# Patient Record
Sex: Male | Born: 1945 | Race: Asian | Hispanic: No | Marital: Married | Smoking: Former smoker
Health system: Southern US, Community
[De-identification: ages and names within clinical notes are randomized; demographics above are authoritative.]

## PROBLEM LIST (undated history)

## (undated) DIAGNOSIS — E785 Hyperlipidemia, unspecified: Secondary | ICD-10-CM

## (undated) DIAGNOSIS — H547 Unspecified visual loss: Secondary | ICD-10-CM

## (undated) DIAGNOSIS — B9681 Helicobacter pylori [H. pylori] as the cause of diseases classified elsewhere: Secondary | ICD-10-CM

## (undated) DIAGNOSIS — I10 Essential (primary) hypertension: Secondary | ICD-10-CM

## (undated) DIAGNOSIS — M79604 Pain in right leg: Secondary | ICD-10-CM

## (undated) DIAGNOSIS — B029 Zoster without complications: Secondary | ICD-10-CM

## (undated) DIAGNOSIS — E119 Type 2 diabetes mellitus without complications: Secondary | ICD-10-CM

## (undated) DIAGNOSIS — C9200 Acute myeloblastic leukemia, not having achieved remission: Secondary | ICD-10-CM

## (undated) DIAGNOSIS — M659 Unspecified synovitis and tenosynovitis, unspecified site: Secondary | ICD-10-CM

## (undated) DIAGNOSIS — K2970 Gastritis, unspecified, without bleeding: Secondary | ICD-10-CM

## (undated) DIAGNOSIS — I639 Cerebral infarction, unspecified: Secondary | ICD-10-CM

## (undated) DIAGNOSIS — G629 Polyneuropathy, unspecified: Secondary | ICD-10-CM

## (undated) HISTORY — DX: Type 2 diabetes mellitus without complications: E11.9

## (undated) HISTORY — DX: Zoster without complications: B02.9

## (undated) HISTORY — DX: Unspecified visual loss: H54.7

## (undated) HISTORY — PX: KIDNEY SURGERY: SHX687

## (undated) HISTORY — DX: Cerebral infarction, unspecified: I63.9

## (undated) HISTORY — DX: Gastritis, unspecified, without bleeding: K29.70

## (undated) HISTORY — DX: Helicobacter pylori (H. pylori) as the cause of diseases classified elsewhere: B96.81

## (undated) HISTORY — DX: Acute myeloblastic leukemia, not having achieved remission: C92.00

## (undated) HISTORY — DX: Essential (primary) hypertension: I10

## (undated) HISTORY — PX: LAPAROTOMY: SHX154

## (undated) HISTORY — PX: CATARACT EXTRACTION: SUR2

## (undated) HISTORY — DX: Pain in right leg: M79.604

## (undated) HISTORY — DX: Unspecified synovitis and tenosynovitis, unspecified site: M65.90

## (undated) HISTORY — DX: Hyperlipidemia, unspecified: E78.5

## (undated) HISTORY — DX: Synovitis and tenosynovitis, unspecified: M65.9

## (undated) HISTORY — DX: Polyneuropathy, unspecified: G62.9

---

## 2008-01-29 ENCOUNTER — Ambulatory Visit: Payer: Self-pay | Admitting: Internal Medicine

## 2008-01-29 DIAGNOSIS — M659 Unspecified synovitis and tenosynovitis, unspecified site: Secondary | ICD-10-CM | POA: Insufficient documentation

## 2008-01-29 DIAGNOSIS — M79609 Pain in unspecified limb: Secondary | ICD-10-CM | POA: Insufficient documentation

## 2008-01-30 ENCOUNTER — Encounter: Payer: Self-pay | Admitting: Internal Medicine

## 2008-01-30 DIAGNOSIS — E785 Hyperlipidemia, unspecified: Secondary | ICD-10-CM

## 2008-01-30 DIAGNOSIS — I10 Essential (primary) hypertension: Secondary | ICD-10-CM | POA: Insufficient documentation

## 2008-01-30 DIAGNOSIS — E1165 Type 2 diabetes mellitus with hyperglycemia: Secondary | ICD-10-CM

## 2008-03-11 ENCOUNTER — Encounter: Payer: Self-pay | Admitting: Internal Medicine

## 2008-03-12 ENCOUNTER — Ambulatory Visit: Payer: Self-pay | Admitting: Internal Medicine

## 2008-03-12 DIAGNOSIS — Z8719 Personal history of other diseases of the digestive system: Secondary | ICD-10-CM | POA: Insufficient documentation

## 2008-03-12 DIAGNOSIS — E1142 Type 2 diabetes mellitus with diabetic polyneuropathy: Secondary | ICD-10-CM

## 2008-03-13 LAB — CONVERTED CEMR LAB
Folate: 12.1 ng/mL
Vitamin B-12: 358 pg/mL (ref 211–911)

## 2008-03-14 ENCOUNTER — Telehealth: Payer: Self-pay | Admitting: Internal Medicine

## 2008-03-22 ENCOUNTER — Telehealth: Payer: Self-pay | Admitting: Internal Medicine

## 2008-05-15 ENCOUNTER — Ambulatory Visit: Payer: Self-pay | Admitting: Internal Medicine

## 2008-07-16 HISTORY — PX: OTHER SURGICAL HISTORY: SHX169

## 2008-09-13 ENCOUNTER — Ambulatory Visit: Payer: Self-pay | Admitting: Internal Medicine

## 2008-09-13 ENCOUNTER — Telehealth: Payer: Self-pay | Admitting: Internal Medicine

## 2008-09-13 ENCOUNTER — Ambulatory Visit: Payer: Self-pay | Admitting: Diagnostic Radiology

## 2008-09-13 ENCOUNTER — Ambulatory Visit (HOSPITAL_BASED_OUTPATIENT_CLINIC_OR_DEPARTMENT_OTHER): Admission: RE | Admit: 2008-09-13 | Discharge: 2008-09-13 | Payer: Self-pay | Admitting: Internal Medicine

## 2008-09-13 DIAGNOSIS — R079 Chest pain, unspecified: Secondary | ICD-10-CM

## 2008-09-25 ENCOUNTER — Encounter: Payer: Self-pay | Admitting: Internal Medicine

## 2008-10-02 ENCOUNTER — Ambulatory Visit: Payer: Self-pay | Admitting: Cardiology

## 2008-12-11 ENCOUNTER — Ambulatory Visit: Payer: Self-pay | Admitting: Internal Medicine

## 2008-12-14 DIAGNOSIS — I6350 Cerebral infarction due to unspecified occlusion or stenosis of unspecified cerebral artery: Secondary | ICD-10-CM | POA: Insufficient documentation

## 2008-12-14 DIAGNOSIS — H543 Unqualified visual loss, both eyes: Secondary | ICD-10-CM

## 2008-12-18 ENCOUNTER — Encounter: Payer: Self-pay | Admitting: Internal Medicine

## 2009-01-23 ENCOUNTER — Telehealth: Payer: Self-pay | Admitting: Internal Medicine

## 2009-03-14 ENCOUNTER — Ambulatory Visit: Payer: Self-pay | Admitting: Internal Medicine

## 2009-03-14 DIAGNOSIS — L02838 Carbuncle of other sites: Secondary | ICD-10-CM

## 2009-03-14 DIAGNOSIS — L02828 Furuncle of other sites: Secondary | ICD-10-CM | POA: Insufficient documentation

## 2009-04-23 ENCOUNTER — Encounter: Payer: Self-pay | Admitting: Internal Medicine

## 2009-05-22 ENCOUNTER — Ambulatory Visit: Payer: Self-pay | Admitting: Internal Medicine

## 2009-05-22 ENCOUNTER — Telehealth: Payer: Self-pay | Admitting: Internal Medicine

## 2009-05-22 DIAGNOSIS — R5383 Other fatigue: Secondary | ICD-10-CM

## 2009-05-22 DIAGNOSIS — L608 Other nail disorders: Secondary | ICD-10-CM | POA: Insufficient documentation

## 2009-05-22 DIAGNOSIS — L259 Unspecified contact dermatitis, unspecified cause: Secondary | ICD-10-CM | POA: Insufficient documentation

## 2009-05-22 DIAGNOSIS — R5381 Other malaise: Secondary | ICD-10-CM

## 2009-05-22 DIAGNOSIS — C9201 Acute myeloblastic leukemia, in remission: Secondary | ICD-10-CM

## 2009-05-22 LAB — CONVERTED CEMR LAB
BUN: 19 mg/dL (ref 6–23)
Basophils Relative: 0 % (ref 0–1)
CO2: 24 meq/L (ref 19–32)
Creatinine, Ser: 1.31 mg/dL (ref 0.40–1.50)
Eosinophils Relative: 3 % (ref 0–5)
Glucose, Bld: 110 mg/dL — ABNORMAL HIGH (ref 70–99)
HCT: 39.5 % (ref 39.0–52.0)
Hemoglobin: 13.5 g/dL (ref 13.0–17.0)
Lymphocytes Relative: 59 % — ABNORMAL HIGH (ref 12–46)
Lymphs Abs: 2.2 10*3/uL (ref 0.7–4.0)
Monocytes Relative: 7 % (ref 3–12)
Neutrophils Relative %: 32 % — ABNORMAL LOW (ref 43–77)
Platelets: 111 10*3/uL — ABNORMAL LOW (ref 150–400)
Sodium: 140 meq/L (ref 135–145)
TSH: 0.352 microintl units/mL (ref 0.350–4.500)
Vit D, 1,25-Dihydroxy: 45 (ref 30–89)
WBC: 3.8 10*3/uL — ABNORMAL LOW (ref 4.0–10.5)

## 2009-05-23 ENCOUNTER — Ambulatory Visit: Payer: Self-pay | Admitting: Hematology & Oncology

## 2009-05-28 ENCOUNTER — Ambulatory Visit: Payer: Self-pay | Admitting: Internal Medicine

## 2009-08-16 DIAGNOSIS — B029 Zoster without complications: Secondary | ICD-10-CM

## 2009-08-16 DIAGNOSIS — C9200 Acute myeloblastic leukemia, not having achieved remission: Secondary | ICD-10-CM

## 2009-08-16 HISTORY — DX: Zoster without complications: B02.9

## 2009-08-16 HISTORY — DX: Acute myeloblastic leukemia, not having achieved remission: C92.00

## 2009-08-22 ENCOUNTER — Telehealth (INDEPENDENT_AMBULATORY_CARE_PROVIDER_SITE_OTHER): Payer: Self-pay | Admitting: *Deleted

## 2009-10-08 ENCOUNTER — Encounter: Payer: Self-pay | Admitting: Internal Medicine

## 2009-12-20 IMAGING — CR DG CHEST 2V
2 series · 2 of 2 positions shown · non-contrast
Comparison: None

CLINICAL DATA: Chest pain

CHEST - 2 VIEW

[w chest pa]
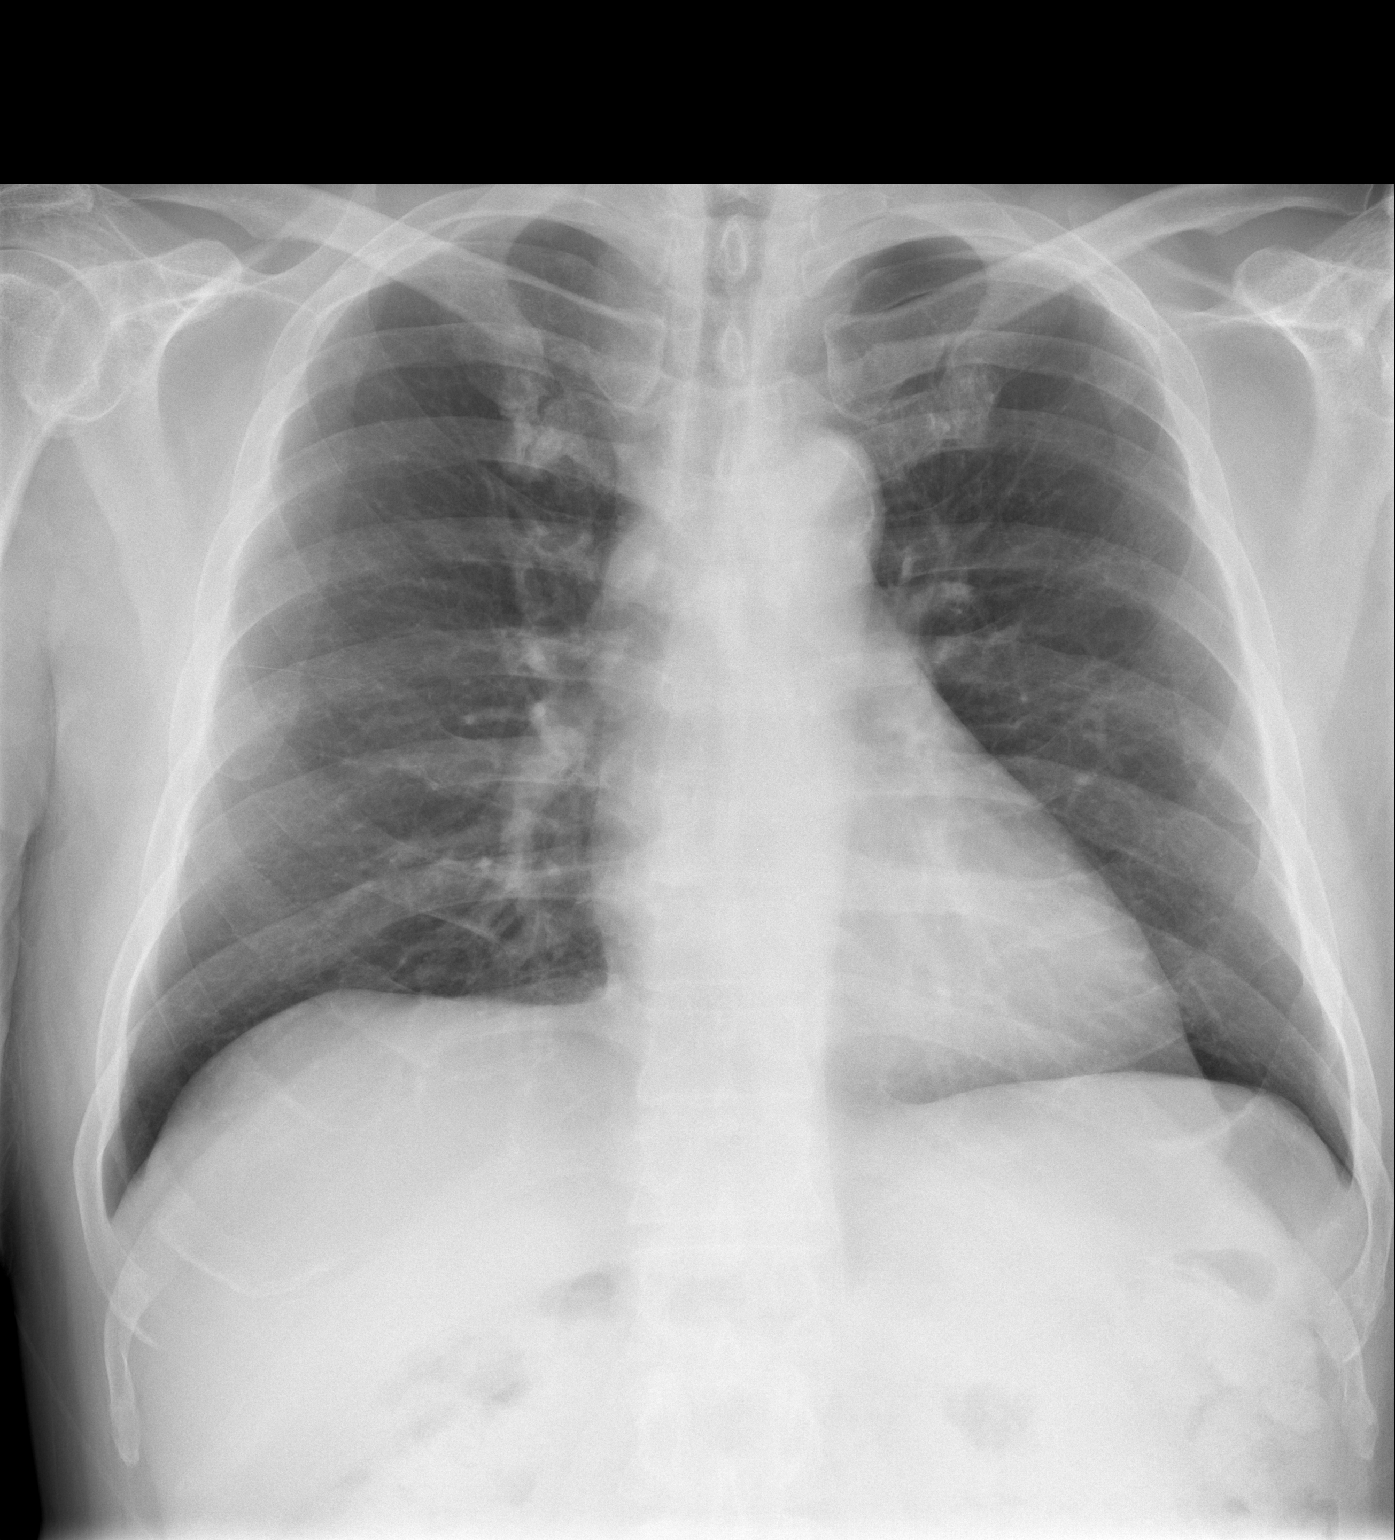

[w chest lat]
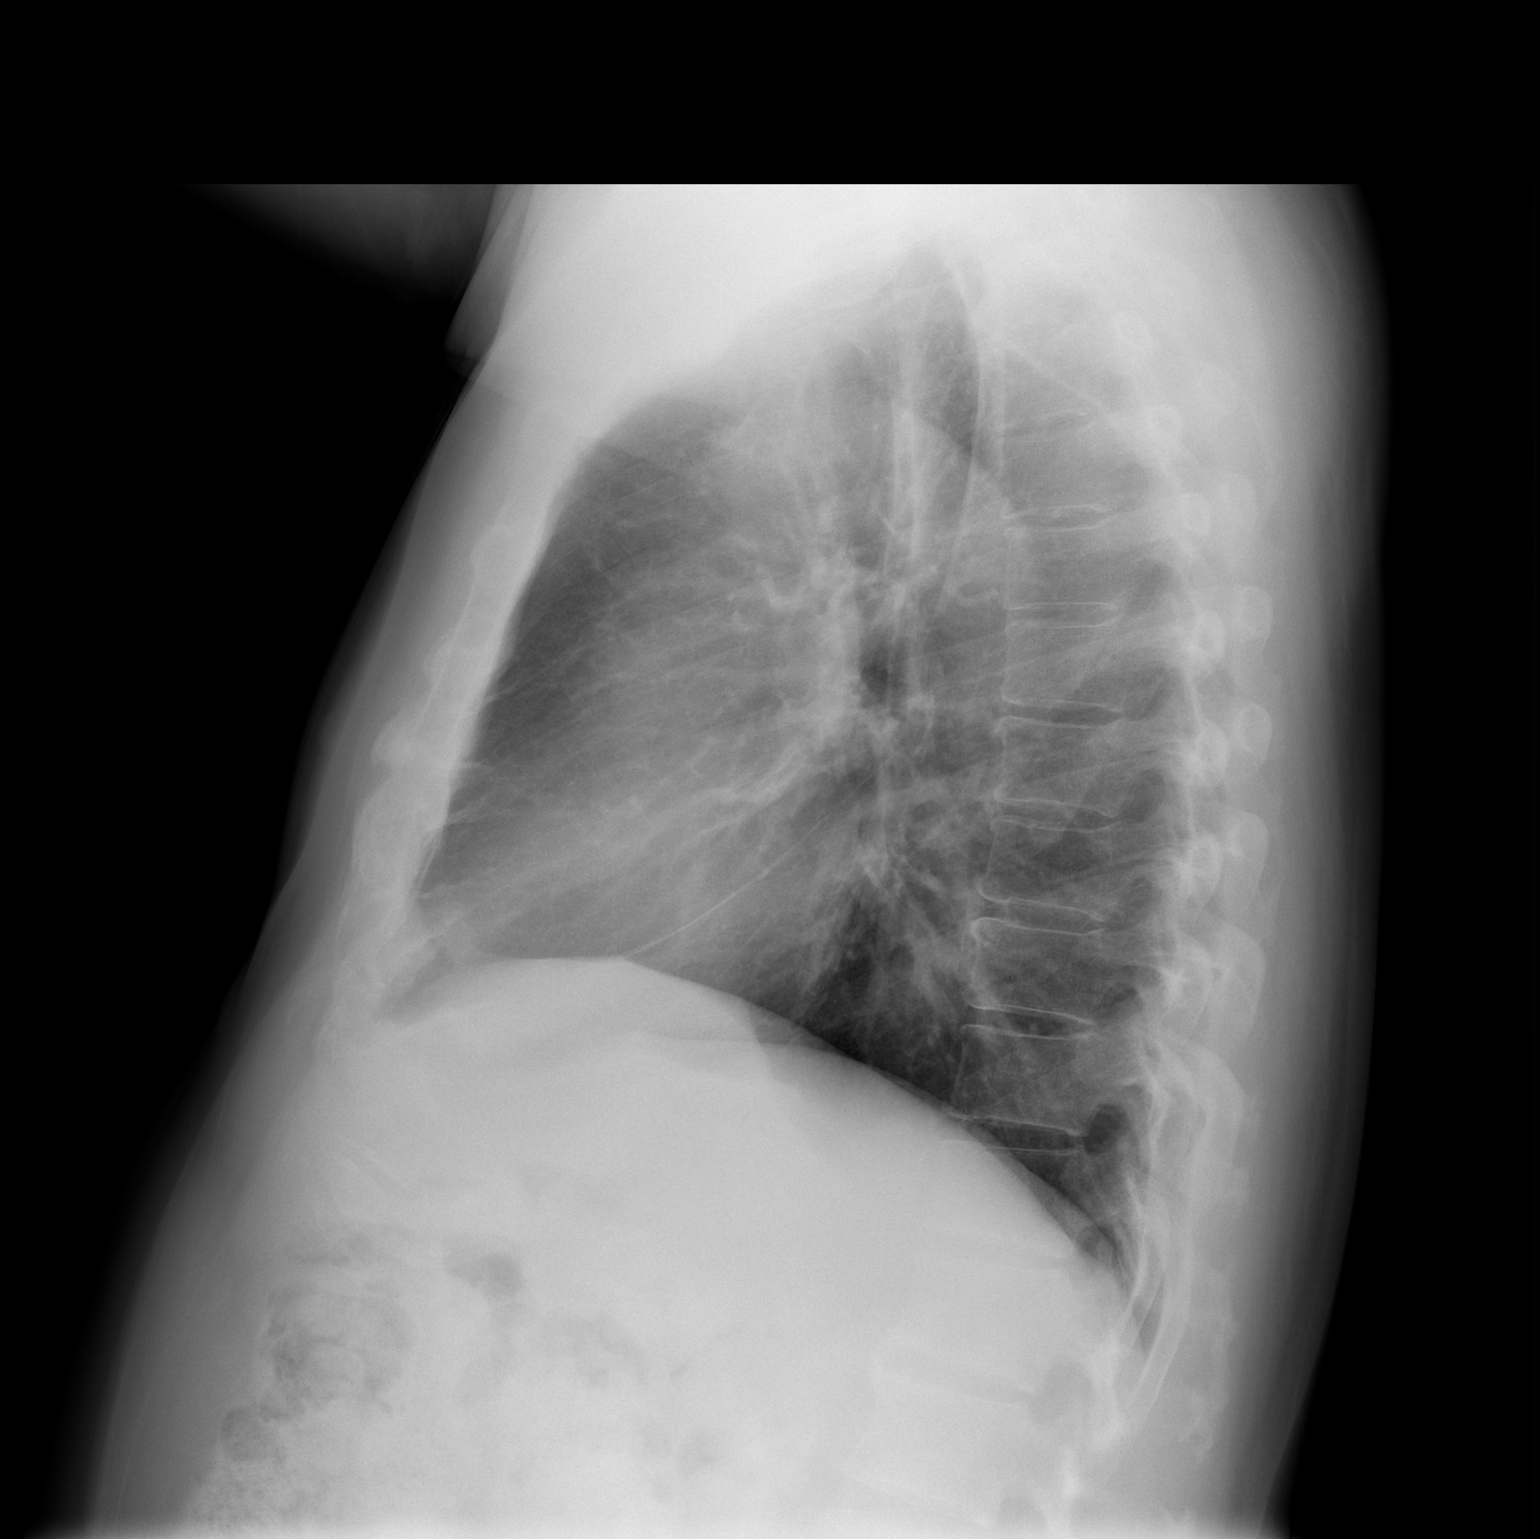

[2 of 2 positions shown; findings below may reference images not displayed]

FINDINGS: Cardiomediastinal silhouette is unremarkable.  No acute
infiltrate or pleural effusion.  No pulmonary edema.  Mild
elevation of the right hemidiaphragm.  Mild degenerative changes
lower thoracic spine. No diagnostic pneumothorax.
IMPRESSION: No active disease.

## 2010-09-06 ENCOUNTER — Encounter: Payer: Self-pay | Admitting: Internal Medicine

## 2010-09-15 NOTE — Progress Notes (Signed)
Summary: Record request from [facility]  Request for records received from [facility]. Request forwarded to [facility]. Dena Chavis  August 22, 2009 9:08 AM

## 2010-12-29 NOTE — Assessment & Plan Note (Signed)
[facility]                            CARDIOLOGY OFFICE NOTE   Andre, Hill                            MRN:          [id number]  DATE:10/02/2008                            DOB:          March 17, 1946    Andre Hill is a pleasant 65 year old gentleman who I am asked to evaluate  for chest pain.  He has no prior cardiac history.  He states that for  the past 1 month, he developed a tightness in the right side of his  chest.  This occurs when he increases the treadmill speed and grade.  It  is relieved with rest.  It did not radiate.  It is not pleuritic or  positional nor is it related to food.  It does not occur at rest.  There  is no associated nausea, vomiting, shortness of breath, or diaphoresis.  Because of his chest pain, we were asked to further evaluate.  Note, he  denies any dyspnea on exertion, orthopnea, PND, pedal edema,  palpitations, presyncope, or syncope.   MEDICATIONS:  At present include,  1. Micardis HCT 80/25 mg tablets one p.o. daily.  2. Glipizide 10 mg p.o. daily  3. Caduet 5/10 daily.  4. Insulin.  5. Eye drops.  6. Multivitamin.  7. Vitamin C.  8. Calcium.  9. Fish oil.   ALLERGIES:  He has no known drug allergies.   SOCIAL HISTORY:  He has remote history of tobacco use, but he has not  smoked in 20 years.  He does state that he has a history of significant  alcohol use in years past, but only consumes 1-2 alcoholic beverages per  year at present.  He is married with three children.  He is originally  from [place].   FAMILY HISTORY:  Negative for coronary artery disease.   PAST MEDICAL HISTORY:  Significant for diabetes, hypertension, and  hyperlipidemia.  Note, he has been diabetic for at least 25 years.  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   REVIEW OF SYSTEMS:  He denies any headaches or fevers, chills.  There is  no productive cough or hemoptysis.  There is no  dysphagia, odynophagia,  melena, or hematochezia.  There is no dysuria or hematuria.  There is no  rash or seizure activity.  There is no orthopnea, PND, or pedal edema.  Remaining systems are negative.  He does occasionally have some  arthritic pains in his knees.   PHYSICAL EXAMINATION:  VITAL SIGNS:  Today, shows a blood pressure of  112/70, his pulse is 76.  He weighs 183 pounds.  GENERAL:  He is well-developed, well-nourished in no acute distress.  SKIN:  Warm and dry.  He does not appear to be depressed.  There is no  peripheral clubbing.  BACK:  Normal.  HEENT:  Normal with normal eyelids.  NECK:  Supple with a normal upstroke bilaterally.  There are no bruits  noted.  There is no jugular venous distention and I cannot appreciate  thyromegaly.  CHEST:  Clear to auscultation.  No expansion.  CARDIOVASCULAR:  Regular rate and rhythm.  Normal S1 and S2.  There are  no murmurs, rubs, or gallops noted.  ABDOMEN:  Nontender, nondistended.  Positive bowel sounds.  No  hepatosplenomegaly.  No mass appreciated.  There is no abdominal bruit.  He has 2+ femoral pulses bilaterally.  No bruits.  EXTREMITIES:  No edema.  I could palpate no cords.  He has 2+ dorsalis  pedis pulses bilaterally.  NEUROLOGIC:  Grossly intact.   His electrocardiogram from September 13, 2008, shows a sinus rhythm at a  rate of 69 with no ST changes.   DIAGNOSES:  1. Chest pain - Andre Hill symptoms are very concerning.  He develops a      right-sided pressure when he exerts himself to more extreme degree      and this is relieved with rest.  He also has diabetes for at least      25 years, hypertension, and hyperlipidemia.  I think his symptoms      are most consistent with angina.  We would recommend adding aspirin      to his medical regimen as well as continue with his statin.  I have      recommended a cardiac catheterization.  However, the patient states      that he has money issues.  I explained the risk  of undiagnosed      coronary artery disease including myocardial infarction and death.      However, he declined the catheterization today stating he would      prefer his talks with Dr. Artist Hill.  I also offered to arrange financial      counseling for the patient.  We also discussed the possibility of a      stress test realizing that if it was abnormal, then we will most      likely proceed with catheterization at that point.  He declined all      of the above.  I have asked him to contact us if he changes his      mind.  We spent a significant amount time in the office discussing      the above issues.  2. Hypertension - His blood pressure is adequately controlled on his      present medications.  3. Hyperlipidemia - He will continue on statin and this is being      followed by Dr. Artist Hill.  4. Diabetes mellitus.   The patient will contact us if he changes his mind about further cardiac  workup.     Andre Hill Andre Som, MD, [facility]  Electronically Signed    BSC/MedQ  DD: 10/02/2008  DT: 10/02/2008  Job #: [id number]

## 2011-03-29 ENCOUNTER — Encounter: Payer: Self-pay | Admitting: Cardiology

## 2012-04-27 HISTORY — PX: COLONOSCOPY: SHX174

## 2012-11-14 ENCOUNTER — Ambulatory Visit (INDEPENDENT_AMBULATORY_CARE_PROVIDER_SITE_OTHER): Payer: Medicare Other | Admitting: Internal Medicine

## 2012-11-14 ENCOUNTER — Encounter: Payer: Self-pay | Admitting: Internal Medicine

## 2012-11-14 VITALS — BP 142/80 | HR 92 | Temp 98.3°F | Ht 67.0 in | Wt 189.0 lb

## 2012-11-14 DIAGNOSIS — C9201 Acute myeloblastic leukemia, in remission: Secondary | ICD-10-CM

## 2012-11-14 DIAGNOSIS — M25511 Pain in right shoulder: Secondary | ICD-10-CM

## 2012-11-14 DIAGNOSIS — M25519 Pain in unspecified shoulder: Secondary | ICD-10-CM

## 2012-11-14 DIAGNOSIS — E785 Hyperlipidemia, unspecified: Secondary | ICD-10-CM

## 2012-11-14 DIAGNOSIS — E119 Type 2 diabetes mellitus without complications: Secondary | ICD-10-CM

## 2012-11-14 DIAGNOSIS — I10 Essential (primary) hypertension: Secondary | ICD-10-CM

## 2012-11-14 LAB — BASIC METABOLIC PANEL
BUN: 15 mg/dL (ref 6–23)
Chloride: 105 mEq/L (ref 96–112)
Creatinine, Ser: 1 mg/dL (ref 0.4–1.5)
Glucose, Bld: 81 mg/dL (ref 70–99)

## 2012-11-14 LAB — HEPATIC FUNCTION PANEL
Alkaline Phosphatase: 70 U/L (ref 39–117)
Bilirubin, Direct: 0.1 mg/dL (ref 0.0–0.3)
Total Bilirubin: 0.9 mg/dL (ref 0.3–1.2)
Total Protein: 7.4 g/dL (ref 6.0–8.3)

## 2012-11-14 LAB — HEMOGLOBIN A1C: Hgb A1c MFr Bld: 7.5 % — ABNORMAL HIGH (ref 4.6–6.5)

## 2012-11-14 LAB — MICROALBUMIN / CREATININE URINE RATIO
Creatinine,U: 29.3 mg/dL
Microalb Creat Ratio: 9.2 mg/g (ref 0.0–30.0)

## 2012-11-14 LAB — LIPID PANEL
Total CHOL/HDL Ratio: 4
VLDL: 93.8 mg/dL — ABNORMAL HIGH (ref 0.0–40.0)

## 2012-11-14 MED ORDER — INSULIN GLARGINE 100 UNIT/ML ~~LOC~~ SOLN: 20.0000 [IU] | Freq: Every day | SUBCUTANEOUS | Status: DC

## 2012-11-14 MED ORDER — INSULIN LISPRO 100 UNIT/ML ~~LOC~~ SOLN: SUBCUTANEOUS | Status: DC

## 2012-11-14 NOTE — Assessment & Plan Note (Signed)
Patient experiencing labile blood sugars. Monitor A1c. Patient advised to adjust mealtime insulin dose based upon carbohydrate intake. Continue same dose of Lantus insulin. Patient experiencing intermittent loose stools. Decrease metformin to 500 mg twice daily

## 2012-11-14 NOTE — Assessment & Plan Note (Signed)
Patient complains of chronic intermittent right shoulder pain. Symptoms worse with laying on right side. He has point tenderness posterior aspect of deltoid. Consider shoulder bursitis. Refer to [facility] orthopedics for further evaluation and treatment.

## 2012-11-14 NOTE — Assessment & Plan Note (Addendum)
Blood pressure is stable off of amlodipine. Screen for microalbuminuria. Consider low dose ACE or ARB. BP: 142/80 mmHg

## 2012-11-14 NOTE — Progress Notes (Signed)
Subjective:    Patient ID: Andre Hill, male    DOB: Feb 01, 1946, 67 y.o.   MRN: [id number]  HPI  67 year old [place] male with history of type 2 diabetes, hypertension and hyperlipidemia to reestablish. Patient last seen at 2010. Significant interval medical history-patient diagnosed with acute myeloid leukemia in 2011. Patient was followed for unspecified neutropenia before he was diagnosed. Patient was treated with chemotherapy at [facility]. Wife reports he had complication of fungal pneumonia. He has been in remission since then. He is followed by his oncologist every 3 months.  Type 2 diabetes-patient transitioned to insulin therapy. He is currently using Lantus 20 units once daily and Humalog 10-16 units before meals. He has had severe hypoglycemia 3 years ago. Over the last several weeks patient reports one episode of hypoglycemia were his blood sugars 49. This occurred in the evening.  Review of Systems  Constitutional: Negative for activity change, appetite change and unexpected weight change.  Eyes: Negative for visual disturbance.  Respiratory: Negative for cough, chest tightness and shortness of breath.   Cardiovascular: Negative for chest pain.  Genitourinary: Negative for difficulty urinating.  Neurological: Negative for headaches.  Gastrointestinal: Negative for abdominal pain, heartburn melena or hematochezia Psych: Negative for depression or anxiety Musculoskeletal:  He complains of intermittent right shoulder pain, symptoms worse with laying on right side       Past Medical History  Diagnosis Date  . Blind     partial  . CVA (cerebral infarction)   . Chest pain     unspec  . Helicobacter pylori gastritis     hx  . Polyneuropathy   . Leg pain, right   . Tenosynovitis   . HTN (hypertension)   . HLD (hyperlipidemia)   . DM2 (diabetes mellitus, type 2)     onset 20-25 yrs  . AML (acute myeloid leukemia) 2011    [facility] - Dr. Miachel Roux  . Shingles  2011    left flank    History   Social History  . Marital Status: Married    Spouse Name: N/A    Number of Children: N/A  . Years of Education: N/A   Occupational History  . Not on file.   Social History Main Topics  . Smoking status: Former Games developer  . Smokeless tobacco: Not on file     Comment: 20 yrs ago (20-25 pack year hx)   . Alcohol Use: No     Comment: quit drinking years ago   . Drug Use:   . Sexually Active:    Other Topics Concern  . Not on file   Social History Narrative  . No narrative on file    Past Surgical History  Procedure Laterality Date  . Cataract extraction    . Laparotomy    . Colonoscopy  04/27/2012    2 small polyps  . Left eye surgery  12/09    for glaucoma  . Kidney surgery      No family history on file.  Allergies not on file  No current outpatient prescriptions on file prior to visit.   No current facility-administered medications on file prior to visit.    BP 142/80  Pulse 92  Temp(Src) 98.3 F (36.8 C) (Oral)  Ht 5\' 7"  (1.702 m)  Wt 189 lb (85.73 kg)  BMI 29.59 kg/m2    Objective:   Physical Exam  Constitutional: He is oriented to person, place, and time. He appears well-developed and well-nourished. No distress.  HENT:  Head: Normocephalic and atraumatic.  Right Ear: External ear normal.  Left Ear: External ear normal.  Mouth/Throat: Oropharynx is clear and moist.  Eyes: Conjunctivae and EOM are normal. Pupils are equal, round, and reactive to light.  Neck: Normal range of motion. Neck supple.  No carotid bruit  Cardiovascular: Normal rate, regular rhythm and normal heart sounds.   Pulmonary/Chest: Effort normal and breath sounds normal. He has no wheezes.  Abdominal: Soft. Bowel sounds are normal. There is no tenderness.  Musculoskeletal: Normal range of motion. He exhibits no edema.  Lymphadenopathy:    He has no cervical adenopathy.  Neurological: He is alert and oriented to person, place, and time. No  cranial nerve deficit.  Skin: Skin is warm and dry.  Psychiatric: He has a normal mood and affect. His behavior is normal.          Assessment & Plan:

## 2012-11-15 LAB — LDL CHOLESTEROL, DIRECT: Direct LDL: 46.3 mg/dL

## 2012-11-17 ENCOUNTER — Encounter: Payer: Self-pay | Admitting: Internal Medicine

## 2012-11-17 ENCOUNTER — Telehealth: Payer: Self-pay | Admitting: Internal Medicine

## 2012-11-17 NOTE — Telephone Encounter (Signed)
Patient called regarding his 4/1 lab results. He spoke with Arline Asp about these, but wants to see them in My Chart. Requesting you release the results this afternoon so he can view. Thank you.

## 2012-11-17 NOTE — Telephone Encounter (Signed)
This call also came to [facility]. Pt with same request. RN checked EPIC to see if RN could review with him but policy states abnormal labs will be reviewed by office. Note sent with same request. Pt waiting to have results in my chart.

## 2012-11-27 ENCOUNTER — Telehealth: Payer: Self-pay | Admitting: Internal Medicine

## 2012-11-27 MED ORDER — HYDROCODONE-HOMATROPINE 5-1.5 MG/5ML PO SYRP: 2.5000 mL | ORAL_SOLUTION | Freq: Three times a day (TID) | ORAL | Status: DC | PRN

## 2012-11-27 NOTE — Telephone Encounter (Signed)
Patient Information:  Caller Name: Wynter  Phone: [phone]  Patient: Andre Hill, Andre Hill  Gender: Male  DOB: 1946-01-04  Age: 67 Years  PCP: Artist Pais Doe-Hyun Molly Maduro) (Adults only)  Office Follow Up:  Does the office need to follow up with this patient?: Yes  Instructions For The Office: Please review and contact patient at  [phone].  RN Note:  Patient declines OV today, says has scheduled Appt 5/1.  Just wanting cough medicine called into [facility]  Symptoms  Reason For Call & Symptoms: Runny nose and cough x3 days, cough bad yesterday 4/13 and difficulty sleeping last night due to cough.  Sore throat and chest discomfort when coughing only, notes slight swelling in back of head.  Reviewed Health History In EMR: Yes  Reviewed Medications In EMR: Yes  Reviewed Allergies In EMR: Yes  Reviewed Surgeries / Procedures: Yes  Date of Onset of Symptoms: 11/24/2012  Guideline(s) Used:  Cough  Disposition Per Guideline:   See Today in Office  Reason For Disposition Reached:   Severe coughing spells (e.g., whooping sound after coughing, vomiting after coughing)  Advice Given:  Coughing Spasms:  Drink warm fluids. Inhale warm mist (Reason: both relax the airway and loosen up the phlegm).  Patient Refused Recommendation:  Patient Requests Prescription  Caller declines OV today 4/14, wanting Cough Medicine called to [facility] [phone].

## 2012-11-27 NOTE — Telephone Encounter (Signed)
Spoke to pt told him will call in cough medicine Hydcodan take 1/2-1 teaspoon three times a day PRN for cough. Pt verbalized understanding. Rx called into  [facility] gave order to [place].

## 2012-11-29 ENCOUNTER — Telehealth: Payer: Self-pay | Admitting: Internal Medicine

## 2012-11-29 MED ORDER — HYDROCOD POLST-CHLORPHEN POLST 10-8 MG/5ML PO LQCR: 5.0000 mL | Freq: Every evening | ORAL | Status: DC | PRN

## 2012-11-29 NOTE — Telephone Encounter (Signed)
Please advise 

## 2012-11-29 NOTE — Telephone Encounter (Signed)
Pt decided to pay cash for the hycodan

## 2012-11-29 NOTE — Telephone Encounter (Signed)
Please see if tussionex is covered.  If not, I suggest he pay cash for hycodan.  It should not be cost prohibitive.  Otherwise, use mucinex over the counter twice daily as needed

## 2012-11-29 NOTE — Telephone Encounter (Signed)
Patient Information:  Caller Name: Stephania Fragmin  Phone: [phone]  Patient: Andre Hill, Andre Hill  Gender: Male  DOB: 1945-10-25  Age: 67 Years  PCP: Artist Pais Doe-Hyun Molly Maduro) (Adults only)  Office Follow Up:  Does the office need to follow up with this patient?: Yes  Instructions For The Office: Rx not coverred by insurance. Contact patient and pharmacy  RN Note:  Please contact patient/pharmacy.  Medication Hycodan not covered by insurance  Symptoms  Reason For Call & Symptoms: Patient states that Dr. Artist Pais called in cough medication yesterday.  He went to his pharmacy and the Hycodan Syrup is not covered by insurance. He is asking for an alternative medication. Please contact patient for assistance  Reviewed Health History In EMR: N/A  Reviewed Medications In EMR: N/A  Reviewed Allergies In EMR: N/A  Reviewed Surgeries / Procedures: N/A  Date of Onset of Symptoms: 11/29/2012  Guideline(s) Used:  No Protocol Available - Sick Adult  Disposition Per Guideline:   Discuss with PCP and Callback by Nurse Today  Reason For Disposition Reached:   Nursing judgment  Advice Given:  Call Back If:  You become worse.  RN Overrode Recommendation:  Patient Requests Prescription  Rx not covered by insurance

## 2012-11-29 NOTE — Telephone Encounter (Signed)
Stated that neither of the 2 cough syrup recently called in where covered by his insurance. She states that after speaking with Hydrocodone w/ acetaminophen 7.5 / 325 per . Please assist.

## 2012-11-29 NOTE — Telephone Encounter (Signed)
rx called in, cancelled hycodan, pt aware

## 2012-12-14 ENCOUNTER — Encounter: Payer: Self-pay | Admitting: Internal Medicine

## 2012-12-14 ENCOUNTER — Ambulatory Visit (INDEPENDENT_AMBULATORY_CARE_PROVIDER_SITE_OTHER): Payer: Medicare Other | Admitting: Internal Medicine

## 2012-12-14 VITALS — BP 140/80 | HR 76 | Temp 98.6°F | Wt 187.0 lb

## 2012-12-14 DIAGNOSIS — I10 Essential (primary) hypertension: Secondary | ICD-10-CM

## 2012-12-14 DIAGNOSIS — E119 Type 2 diabetes mellitus without complications: Secondary | ICD-10-CM

## 2012-12-14 MED ORDER — ATORVASTATIN CALCIUM 20 MG PO TABS: 20.0000 mg | ORAL_TABLET | Freq: Every day | ORAL | Status: DC

## 2012-12-14 MED ORDER — LOSARTAN POTASSIUM 50 MG PO TABS: 50.0000 mg | ORAL_TABLET | Freq: Every day | ORAL | Status: DC

## 2012-12-14 MED ORDER — INSULIN LISPRO 100 UNIT/ML ~~LOC~~ SOLN: SUBCUTANEOUS | Status: DC

## 2012-12-14 MED ORDER — AZELASTINE HCL 0.05 % OP SOLN: 1.0000 [drp] | Freq: Two times a day (BID) | OPHTHALMIC | Status: DC

## 2012-12-14 NOTE — Assessment & Plan Note (Signed)
Patient's blood sugars are relatively well controlled. He is having less hypoglycemia with lower dose of metformin. Patient advised to use slightly higher dose of mealtime insulin with a.m. Meal (18 units). Patient also advised to increase Lantus to 20 units once daily. I stressed importance of avoiding hypoglycemia. Patient not sure whether he is taking Lipitor or Caduet. I suggest starting losartan for renal protection. Lab Results  Component Value Date   HGBA1C 7.5* 11/14/2012   Lab Results  Component Value Date   CREATININE 1.0 11/14/2012

## 2012-12-14 NOTE — Assessment & Plan Note (Signed)
Start ARB - losartan.  Patient advised to decrease losartan to 25 mg should he experience dizziness.  BP: 140/80 mmHg

## 2012-12-14 NOTE — Patient Instructions (Addendum)
Please complete the following lab tests before your next follow up appointment: BMET - 401.9 Contact our office if you experience dizziness.

## 2012-12-14 NOTE — Progress Notes (Signed)
Subjective:    Patient ID: Andre Hill, male    DOB: 01/07/1946, 67 y.o.   MRN: [id number]  HPI  67 year old Asian male with type 2 diabetes for followup. Patient denies any further issues with hypoglycemia. He is currently taking 18 units of Lantus daily. He reports eating 2 main meals per day. His late morning meal is his largest carbohydrate intake. He is currently using 16 units before a.m. meal. His postprandial blood sugars are between 180s and 200. His blood sugar before bedtime is in the 150s.  Fasting AM blood sugars 160's- 170's.  Patient's lab results reviewed. His A1c is 7.5. His triglycerides are elevated however this is nonfasting. He has minimal elevation and urine microalbumin.  Review of Systems Negative for chest pain,  Complains of irritated eyes from pollen    Past Medical History  Diagnosis Date  . Blind     partial  . CVA (cerebral infarction)   . Chest pain     unspec  . Helicobacter pylori gastritis     hx  . Polyneuropathy   . Leg pain, right   . Tenosynovitis   . HTN (hypertension)   . HLD (hyperlipidemia)   . DM2 (diabetes mellitus, type 2)     onset 20-25 yrs  . AML (acute myeloid leukemia) 2011    [facility] - Dr. Miachel Roux  . Shingles 2011    left flank    History   Social History  . Marital Status: Married    Spouse Name: N/A    Number of Children: N/A  . Years of Education: N/A   Occupational History  . Not on file.   Social History Main Topics  . Smoking status: Former Games developer  . Smokeless tobacco: Not on file     Comment: 20 yrs ago (20-25 pack year hx)   . Alcohol Use: No     Comment: quit drinking years ago   . Drug Use:   . Sexually Active:    Other Topics Concern  . Not on file   Social History Narrative  . No narrative on file    Past Surgical History  Procedure Laterality Date  . Cataract extraction    . Laparotomy    . Colonoscopy  04/27/2012    2 small polyps  . Left eye surgery  12/09    for glaucoma  .  Kidney surgery      No family history on file.  Not on File  Current Outpatient Prescriptions on File Prior to Visit  Medication Sig Dispense Refill  . ACCU-CHEK AVIVA PLUS test strip 1 each 3 (three) times daily.      . insulin glargine (LANTUS SOLOSTAR) 100 UNIT/ML injection Inject 0.2 mLs (20 Units total) into the skin daily.  5 pen  5  . Lancets (ACCU-CHEK MULTICLIX) lancets 1 each 3 (three) times daily.      . metFORMIN (GLUCOPHAGE) 1000 MG tablet Take 0.5 tablets (500 mg total) by mouth 2 (two) times daily.      . vitamin C (ASCORBIC ACID) 500 MG tablet Take 500 mg by mouth daily.       No current facility-administered medications on file prior to visit.    BP 140/80  Pulse 76  Temp(Src) 98.6 F (37 C) (Oral)  Wt 187 lb (84.823 kg)  BMI 29.28 kg/m2    Objective:   Physical Exam  Constitutional: He appears well-developed and well-nourished.  Cardiovascular: Normal rate, regular rhythm and normal heart sounds.  Pulmonary/Chest: Effort normal and breath sounds normal. He has no wheezes.  Neurological: No cranial nerve deficit.  Psychiatric: He has a normal mood and affect. His behavior is normal.          Assessment & Plan:

## 2012-12-18 ENCOUNTER — Other Ambulatory Visit: Payer: Self-pay | Admitting: *Deleted

## 2012-12-18 MED ORDER — INSULIN LISPRO 100 UNIT/ML (KWIKPEN): PEN_INJECTOR | SUBCUTANEOUS | Status: DC

## 2013-01-25 ENCOUNTER — Ambulatory Visit (INDEPENDENT_AMBULATORY_CARE_PROVIDER_SITE_OTHER): Payer: Medicare Other | Admitting: Internal Medicine

## 2013-01-25 ENCOUNTER — Encounter: Payer: Self-pay | Admitting: Internal Medicine

## 2013-01-25 VITALS — BP 130/70 | HR 80 | Temp 98.7°F | Resp 16 | Ht 67.0 in | Wt 184.0 lb

## 2013-01-25 DIAGNOSIS — I10 Essential (primary) hypertension: Secondary | ICD-10-CM

## 2013-01-25 MED ORDER — METFORMIN HCL 500 MG PO TABS: 500.0000 mg | ORAL_TABLET | Freq: Two times a day (BID) | ORAL | Status: DC

## 2013-01-25 NOTE — Assessment & Plan Note (Signed)
Patient experienced dizziness with even low-dose valsartan. He discontinued. He is currently taking Caduet which includes amlodipine. He does not know current dose. I would like to decrease amlodipine dose and retry losartan 25 mg considering issues with microalbuminuria.  BP: 130/70 mmHg

## 2013-01-25 NOTE — Patient Instructions (Addendum)
Please complete the following lab tests before your next follow up appointment: BMET, A1c, microalb / cr ratio - 250.02 B12 level - 289.89

## 2013-01-25 NOTE — Progress Notes (Signed)
Subjective:    Patient ID: Andre Hill, male    DOB: 11/20/1945, 67 y.o.   MRN: [id number]  HPI  67 year old Asian male for followup regarding type 2 diabetes and hypertension. At previous visit patient was started on losartan 50 mg once daily. He had evidence of mild microalbuminuria. Patient reports he is also taking Caduet at home. He has been doing so for years. He does not recall dose of amlodipine. Patient experienced dizziness with taking 25 mg of losartan. He discontinued losartan.  Patient had recent followup with hematologist about this. His white blood cells are normal. He is not anemic. 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 Patient also to have macrocytosis of RBCs.  He also complains of pruritic insect bites of his lower leg.  Less GI side effects with lower dose of metformin.  Review of Systems Blood sugars are stable.  He is getting better at adjusting his meal time insulin dose.  Past Medical History  Diagnosis Date  . Blind     partial  . CVA (cerebral infarction)   . Chest pain     unspec  . Helicobacter pylori gastritis     hx  . Polyneuropathy   . Leg pain, right   . Tenosynovitis   . HTN (hypertension)   . HLD (hyperlipidemia)   . DM2 (diabetes mellitus, type 2)     onset 20-25 yrs  . AML (acute myeloid leukemia) 2011    [facility] - Dr. Miachel Roux  . Shingles 2011    left flank    History   Social History  . Marital Status: Married    Spouse Name: N/A    Number of Children: N/A  . Years of Education: N/A   Occupational History  . Not on file.   Social History Main Topics  . Smoking status: Former Games developer  . Smokeless tobacco: Not on file     Comment: 20 yrs ago (20-25 pack year hx)   . Alcohol Use: No     Comment: quit drinking years ago   . Drug Use:   . Sexually Active:    Other Topics Concern  . Not on file   Social History Narrative  . No narrative on file    Past Surgical History  Procedure Laterality Date  . Cataract extraction    .  Laparotomy    . Colonoscopy  04/27/2012    2 small polyps  . Left eye surgery  12/09    for glaucoma  . Kidney surgery      No family history on file.  Not on File  Current Outpatient Prescriptions on File Prior to Visit  Medication Sig Dispense Refill  . ACCU-CHEK AVIVA PLUS test strip 1 each 3 (three) times daily.      Marland Kitchen atorvastatin (LIPITOR) 20 MG tablet Take 1 tablet (20 mg total) by mouth daily.  90 tablet  1  . dorzolamide (TRUSOPT) 2 % ophthalmic solution Place 1 drop into both eyes 3 (three) times daily.      . insulin glargine (LANTUS SOLOSTAR) 100 UNIT/ML injection Inject 0.2 mLs (20 Units total) into the skin daily.  5 pen  5  . insulin lispro (HUMALOG KWIKPEN) 100 unit/mL SOLN Inject 18 units at breakfast, 12 units at lunch, and 10 units at dinner  3 mL  5  . Lancets (ACCU-CHEK MULTICLIX) lancets 1 each 3 (three) times daily.      Marland Kitchen LUMIGAN 0.01 % SOLN Place 1 drop into both eyes at bedtime.      Marland Kitchen  vitamin C (ASCORBIC ACID) 500 MG tablet Take 500 mg by mouth daily.       No current facility-administered medications on file prior to visit.    BP 130/70  Pulse 80  Temp(Src) 98.7 F (37.1 C)  Resp 16  Ht 5\' 7"  (1.702 m)  Wt 184 lb (83.462 kg)  BMI 28.81 kg/m2       Objective:   Physical Exam  Constitutional: He is oriented to person, place, and time. He appears well-developed and well-nourished.  Cardiovascular: Normal rate, regular rhythm and normal heart sounds.   No murmur heard. Pulmonary/Chest: Effort normal and breath sounds normal. He has no wheezes.  Musculoskeletal: He exhibits no edema.  Neurological: He is alert and oriented to person, place, and time. No cranial nerve deficit.  Psychiatric: He has a normal mood and affect. His behavior is normal.          Assessment & Plan:

## 2013-03-29 ENCOUNTER — Ambulatory Visit: Payer: Medicare Other | Admitting: Internal Medicine

## 2013-03-29 ENCOUNTER — Ambulatory Visit (INDEPENDENT_AMBULATORY_CARE_PROVIDER_SITE_OTHER): Payer: Medicare Other | Admitting: Internal Medicine

## 2013-03-29 VITALS — BP 124/70 | HR 88 | Temp 98.2°F | Resp 16 | Ht 67.0 in | Wt 186.0 lb

## 2013-03-29 DIAGNOSIS — E119 Type 2 diabetes mellitus without complications: Secondary | ICD-10-CM

## 2013-03-29 DIAGNOSIS — I10 Essential (primary) hypertension: Secondary | ICD-10-CM

## 2013-03-29 DIAGNOSIS — R079 Chest pain, unspecified: Secondary | ICD-10-CM

## 2013-03-29 LAB — HEMOGLOBIN A1C: Hgb A1c MFr Bld: 7.7 % — ABNORMAL HIGH (ref 4.6–6.5)

## 2013-03-29 LAB — BASIC METABOLIC PANEL
CO2: 28 mEq/L (ref 19–32)
Calcium: 9.4 mg/dL (ref 8.4–10.5)
Chloride: 105 mEq/L (ref 96–112)
Creatinine, Ser: 1.2 mg/dL (ref 0.4–1.5)
Sodium: 140 mEq/L (ref 135–145)

## 2013-03-29 MED ORDER — INSULIN PEN NEEDLE 31G X 8 MM MISC: Status: DC

## 2013-03-29 MED ORDER — ACCU-CHEK MULTICLIX LANCETS MISC: Status: DC

## 2013-03-29 MED ORDER — LOSARTAN POTASSIUM 25 MG PO TABS: 25.0000 mg | ORAL_TABLET | Freq: Every day | ORAL | Status: DC

## 2013-03-29 MED ORDER — GLUCOSE BLOOD VI STRP: ORAL_STRIP | Status: DC

## 2013-03-29 MED ORDER — AMLODIPINE-ATORVASTATIN 5-20 MG PO TABS: 1.0000 | ORAL_TABLET | Freq: Every day | ORAL | Status: DC

## 2013-03-29 MED ORDER — INSULIN PEN NEEDLE 31G X 6 MM MISC: Status: DC

## 2013-03-29 NOTE — Assessment & Plan Note (Signed)
67 year old Asian male with multiple risk factors complains of exertional chest heaviness. His last stress test was years ago while he was living in [place]. Refer to cardiologist at [facility] for further cardiac evaluation.

## 2013-03-29 NOTE — Patient Instructions (Addendum)
Please complete the following lab tests before your next follow up appointment: BMET - 401.9 Our office will contact you re: cardiology referral

## 2013-03-29 NOTE — Progress Notes (Signed)
Subjective:    Patient ID: Andre Hill, male    DOB: Feb 01, 1946, 67 y.o.   MRN: [id number]  HPI  67 year old Asian male for followup regarding type 2 diabetes and hypertension. Patient never started Losartan.  Patient taking Caduet 10/20 one half tablet daily. Previous blood work showed minimal microalbuminuria.  Patient reports his blood sugars fairly stable. Fasting blood sugars are between 120-130. Occasionally after large evening meal, morning blood sugar over 190. This is despite taking 18 units of short-acting insulin with evening meal. Rarely he'll forget to take a short acting insulin and he has taken insulin after his meal. He has rare hypoglycemia.    Review of Systems He has intermittent chest heaviness with exertion.  It has been several years since his last cardiac stress test (Performed while he was living in [place])    Past Medical History  Diagnosis Date  . Blind     partial  . CVA (cerebral infarction)   . Chest pain     unspec  . Helicobacter pylori gastritis     hx  . Polyneuropathy   . Leg pain, right   . Tenosynovitis   . HTN (hypertension)   . HLD (hyperlipidemia)   . DM2 (diabetes mellitus, type 2)     onset 20-25 yrs  . AML (acute myeloid leukemia) 2011    [facility] - Dr. Miachel Roux  . Shingles 2011    left flank    History   Social History  . Marital Status: Married    Spouse Name: N/A    Number of Children: N/A  . Years of Education: N/A   Occupational History  . Not on file.   Social History Main Topics  . Smoking status: Former Games developer  . Smokeless tobacco: Not on file     Comment: 20 yrs ago (20-25 pack year hx)   . Alcohol Use: No     Comment: quit drinking years ago   . Drug Use:   . Sexual Activity:    Other Topics Concern  . Not on file   Social History Narrative  . No narrative on file    Past Surgical History  Procedure Laterality Date  . Cataract extraction    . Laparotomy    . Colonoscopy  04/27/2012    2  small polyps  . Left eye surgery  12/09    for glaucoma  . Kidney surgery      No family history on file.  Not on File  Current Outpatient Prescriptions on File Prior to Visit  Medication Sig Dispense Refill  . dorzolamide (TRUSOPT) 2 % ophthalmic solution Place 1 drop into both eyes 3 (three) times daily.      . insulin glargine (LANTUS SOLOSTAR) 100 UNIT/ML injection Inject 0.2 mLs (20 Units total) into the skin daily.  5 pen  5  . insulin lispro (HUMALOG KWIKPEN) 100 unit/mL SOLN Inject 18 units at breakfast, 12 units at lunch, and 10 units at dinner  3 mL  5  . LUMIGAN 0.01 % SOLN Place 1 drop into both eyes at bedtime.      . metFORMIN (GLUCOPHAGE) 500 MG tablet Take 1 tablet (500 mg total) by mouth 2 (two) times daily with a meal.  180 tablet  3  . vitamin C (ASCORBIC ACID) 500 MG tablet Take 500 mg by mouth daily.       No current facility-administered medications on file prior to visit.    BP 124/70  Pulse 88  Temp(Src) 98.2 F (36.8 C)  Resp 16  Ht 5\' 7"  (1.702 m)  Wt 186 lb (84.369 kg)  BMI 29.12 kg/m2    Objective:   Physical Exam  Constitutional: He is oriented to person, place, and time. He appears well-developed and well-nourished.  HENT:  Head: Normocephalic and atraumatic.  Neck: Neck supple.  Cardiovascular: Normal rate, regular rhythm and normal heart sounds.   No murmur heard. Pulmonary/Chest: Effort normal and breath sounds normal. He has no wheezes.  Lymphadenopathy:    He has no cervical adenopathy.  Neurological: He is alert and oriented to person, place, and time. No cranial nerve deficit.  Skin: Skin is warm and dry.  Psychiatric: He has a normal mood and affect. His behavior is normal.          Assessment & Plan:

## 2013-03-29 NOTE — Assessment & Plan Note (Signed)
Patient advised to avoid taking insulin after his meal.  Monitor A1c.  Patient understands to further increase in mealtime insulin dose for large carbohydrate meals. Lab Results  Component Value Date   HGBA1C 7.7* 03/29/2013

## 2013-03-29 NOTE — Assessment & Plan Note (Signed)
Decrease amlodipine to one half tablet 5/20.  Add losartan 25 mg for renal protection. BP: 124/70 mmHg

## 2013-05-07 ENCOUNTER — Other Ambulatory Visit: Payer: Self-pay | Admitting: *Deleted

## 2013-05-07 MED ORDER — INSULIN GLARGINE 100 UNIT/ML ~~LOC~~ SOLN: 20.0000 [IU] | Freq: Every day | SUBCUTANEOUS | Status: DC

## 2013-05-14 ENCOUNTER — Ambulatory Visit: Payer: Medicare Other | Admitting: Internal Medicine

## 2013-05-17 ENCOUNTER — Encounter: Payer: Self-pay | Admitting: *Deleted

## 2013-05-17 ENCOUNTER — Encounter: Payer: Self-pay | Admitting: Internal Medicine

## 2013-05-17 ENCOUNTER — Ambulatory Visit (INDEPENDENT_AMBULATORY_CARE_PROVIDER_SITE_OTHER): Payer: Medicare Other | Admitting: Internal Medicine

## 2013-05-17 VITALS — BP 114/66 | HR 84 | Temp 98.0°F | Ht 67.0 in | Wt 186.0 lb

## 2013-05-17 DIAGNOSIS — Z23 Encounter for immunization: Secondary | ICD-10-CM

## 2013-05-17 DIAGNOSIS — I10 Essential (primary) hypertension: Secondary | ICD-10-CM

## 2013-05-17 DIAGNOSIS — R079 Chest pain, unspecified: Secondary | ICD-10-CM

## 2013-05-17 DIAGNOSIS — E119 Type 2 diabetes mellitus without complications: Secondary | ICD-10-CM

## 2013-05-17 LAB — BASIC METABOLIC PANEL
CO2: 25 mEq/L (ref 19–32)
Calcium: 9.6 mg/dL (ref 8.4–10.5)
GFR: 57.86 mL/min — ABNORMAL LOW (ref >60.00)
Sodium: 139 mEq/L (ref 135–145)

## 2013-05-17 MED ORDER — INSULIN PEN NEEDLE 31G X 6 MM MISC: Status: DC

## 2013-05-17 MED ORDER — ACCU-CHEK MULTICLIX LANCETS MISC: Status: DC

## 2013-05-17 MED ORDER — INSULIN LISPRO 100 UNIT/ML (KWIKPEN): PEN_INJECTOR | SUBCUTANEOUS | Status: DC

## 2013-05-17 MED ORDER — LOSARTAN POTASSIUM 25 MG PO TABS: 25.0000 mg | ORAL_TABLET | Freq: Every day | ORAL | Status: DC

## 2013-05-17 MED ORDER — METFORMIN HCL 500 MG PO TABS: 500.0000 mg | ORAL_TABLET | Freq: Two times a day (BID) | ORAL | Status: DC

## 2013-05-17 MED ORDER — GLUCOSE BLOOD VI STRP: ORAL_STRIP | Status: DC

## 2013-05-17 MED ORDER — AMLODIPINE-ATORVASTATIN 5-20 MG PO TABS: 0.5000 | ORAL_TABLET | Freq: Every day | ORAL | Status: DC

## 2013-05-17 MED ORDER — INSULIN GLARGINE 100 UNIT/ML ~~LOC~~ SOLN: 20.0000 [IU] | Freq: Every day | SUBCUTANEOUS | Status: DC

## 2013-05-17 NOTE — Progress Notes (Signed)
Subjective:    Patient ID: Andre Hill, male    DOB: [birth year], 67 y.o.   MRN: [id number]  HPI  67 year old [place] male with type 2 diabetes, acute myeloid leukemia in remission, and hypertension for followup.  At previous visit patient was referred to cardiologist at [facility] regarding chest pain. He felt he had typical and atypical features. They offered stress Myoview but patient deferred. Patient reports his symptoms are usually nonexertional.  Patient started losartan 25 mg as directed. He is taking half dose of Caduet. He has had intermittent issues with mild dizziness.  He is not exactly sure which dose of caduet his taking.     Review of Systems Negative for wheezing or shortness of breath.  No cough    Past Medical History  Diagnosis Date  . Blind     partial  . CVA (cerebral infarction)   . Chest pain     unspec  . Helicobacter pylori gastritis     hx  . Polyneuropathy   . Leg pain, right   . Tenosynovitis   . HTN (hypertension)   . HLD (hyperlipidemia)   . DM2 (diabetes mellitus, type 2)     onset 20-25 yrs  . AML (acute myeloid leukemia) 2011    [facility] - Dr. Miachel Roux  . Shingles 2011    left flank    History   Social History  . Marital Status: Married    Spouse Name: N/A    Number of Children: N/A  . Years of Education: N/A   Occupational History  . Not on file.   Social History Main Topics  . Smoking status: Former Games developer  . Smokeless tobacco: Not on file     Comment: 20 yrs ago (20-25 pack year hx)   . Alcohol Use: No     Comment: quit drinking years ago   . Drug Use:   . Sexual Activity:    Other Topics Concern  . Not on file   Social History Narrative  . No narrative on file    Past Surgical History  Procedure Laterality Date  . Cataract extraction    . Laparotomy    . Colonoscopy  04/27/2012    2 small polyps  . Left eye surgery  12/09    for glaucoma  . Kidney surgery      No family history on file.  Not on  File  Current Outpatient Prescriptions on File Prior to Visit  Medication Sig Dispense Refill  . dorzolamide (TRUSOPT) 2 % ophthalmic solution Place 1 drop into both eyes 3 (three) times daily.      Marland Kitchen LUMIGAN 0.01 % SOLN Place 1 drop into both eyes at bedtime.      . vitamin C (ASCORBIC ACID) 500 MG tablet Take 500 mg by mouth daily.       No current facility-administered medications on file prior to visit.    BP 114/66  Pulse 84  Temp(Src) 98 F (36.7 C) (Oral)  Ht 5\' 7"  (1.702 m)  Wt 186 lb (84.369 kg)  BMI 29.12 kg/m2    Objective:   Physical Exam  Constitutional: He is oriented to person, place, and time. He appears well-developed and well-nourished. No distress.  HENT:  Head: Normocephalic and atraumatic.  Eyes: EOM are normal. Pupils are equal, round, and reactive to light.  Cardiovascular: Normal rate, regular rhythm and normal heart sounds.   No murmur heard. Pulmonary/Chest: Effort normal and breath sounds normal. He has no wheezes.  Neurological: He is alert and oriented to person, place, and time. No cranial nerve deficit.  Psychiatric: He has a normal mood and affect. His behavior is normal.          Assessment & Plan:

## 2013-05-17 NOTE — Assessment & Plan Note (Signed)
Continue same dose insulin and metformin.  Monitor A1c before next OV

## 2013-05-17 NOTE — Assessment & Plan Note (Signed)
Patient seen by cardiologist at [facility]. He apparently had normal stress echo in 2012.  Cardiologist felt he had atypical and typical features. He declined/deferred stress Myoview. We discussed patient returning to cardiology if he develops exertional symptoms. He expresses understanding and agrees.

## 2013-05-17 NOTE — Patient Instructions (Signed)
Please complete the following lab tests before your next follow up appointment: BMET - 401.9 A1c - 250.02 

## 2013-05-17 NOTE — Assessment & Plan Note (Signed)
Patient advised to return with all of his medications so nursing staff can perform medication reconciliation.  Monitor BMET since starting Losartan. BP: 114/66 mmHg

## 2013-08-01 ENCOUNTER — Telehealth: Payer: Self-pay | Admitting: Internal Medicine

## 2013-08-01 NOTE — Telephone Encounter (Signed)
Patient Information:  Caller Name: [place]  Phone: [phone]  Patient: Andre Hill, Andre Hill  Gender: Male  DOB: 1946-03-05  Age: 67 Years  PCP: Artist Pais Doe-Hyun Molly Maduro) (Adults only)  Office Follow Up:  Does the office need to follow up with this patient?: No  Instructions For The Office: N/A  RN Note:  Informed there is no medication for colds.  Hydrate and humidify.  Offered and accepted appointment;  refused to be seen at another office or by any other provider other than Dr Artist Pais.  Dr Artist Pais is full 08/01/13 and out of the office 08/02/13. Transferred to office scheduler for appointment 08/03/13.  Symptoms  Reason For Call & Symptoms: Rhinorrhea with cough and sore throat. FBS 167 at  0730.  Called to ask for medication  Reviewed Health History In EMR: Yes  Reviewed Medications In EMR: Yes  Reviewed Allergies In EMR: Yes  Reviewed Surgeries / Procedures: Yes  Date of Onset of Symptoms: 07/28/2013  Guideline(s) Used:  Colds  Disposition Per Guideline:   Home Care  Reason For Disposition Reached:   Colds with no complications  Advice Given:  Reassurance  It sounds like an uncomplicated cold that we can treat at home.  Colds are very common and may make you feel uncomfortable.  Colds are caused by viruses, and no medicine or "shot" will cure an uncomplicated cold.  Colds are usually not serious.  For a Runny Nose With Profuse Discharge:   Nasal mucus and discharge helps to wash viruses and bacteria out of the nose and sinuses.  Blowing the nose is all that is needed.  For a Stuffy Nose - Use Nasal Washes:  Introduction: Saline (salt water) nasal irrigation (nasal wash) is an effective and simple home remedy for treating stuffy nose and sinus congestion. The nose can be irrigated by pouring, spraying, or squirting salt water into the nose and then letting it run back out.  Treatment for Associated Symptoms of Colds:  For muscle aches, headaches, or moderate fever (more than 101 F or  38.9 C): Take acetaminophen every 4 hours.  Sore throat: Try throat lozenges, hard candy, or warm chicken broth.  Cough: Use cough drops.  Hydrate: Drink adequate liquids.  Humidifier:  If the air in your home is dry, use a cool-mist humidifier  Expected Course:   Nasal discharge 7-14 days  Cough up to 2-3 weeks.  Call Back If:  Difficulty breathing occurs  Fever lasts more than 3 days  Nasal discharge lasts more than 10 days  Cough lasts more than 3 weeks  You become worse  RN Overrode Recommendation:  Make Appointment  Prefers to be seen.

## 2013-08-01 NOTE — Telephone Encounter (Signed)
Pt refuses to see anyone else except Dr. Artist Pais who has no openings until January.  He insists on something being called in for him without coming in to the doctor. Please advise.

## 2013-08-03 NOTE — Telephone Encounter (Signed)
Call pt - we can not call in abx or other medications.  Suggest ov with another provider.  If he has any flu like symptoms, i suggest he come in for rapid flu as part of ov.

## 2013-08-03 NOTE — Telephone Encounter (Signed)
Pt was upset that Dr Artist Pais was not going to call him anything.  He said he will change providers.  I told pt that was his choice.  He refused to be seen by anyone else and said he did not understand and will look for another PCP.

## 2013-08-17 ENCOUNTER — Other Ambulatory Visit: Payer: Medicare Other

## 2013-08-24 ENCOUNTER — Ambulatory Visit: Payer: Medicare Other | Admitting: Internal Medicine

## 2013-08-29 ENCOUNTER — Other Ambulatory Visit: Payer: Self-pay | Admitting: Internal Medicine

## 2013-10-22 ENCOUNTER — Encounter (INDEPENDENT_AMBULATORY_CARE_PROVIDER_SITE_OTHER): Payer: Self-pay | Admitting: Ophthalmology

## 2013-11-06 ENCOUNTER — Telehealth: Payer: Self-pay | Admitting: Internal Medicine

## 2013-11-06 NOTE — Telephone Encounter (Signed)
Patient Information:  Caller Name: Kino  Phone: [phone]  Patient: Andre Hill, Andre Hill  Gender: Male  DOB: [birth year]  Age: 68 Years  PCP: Shawna Orleans, Doe-Hyun Herbie Baltimore) (Adults only)  Office Follow Up:  Does the office need to follow up with this patient?: Yes  Instructions For The Office: needs instructions on how much insulin to take today  RN Note:  spoke with [place] in the office; advised sending note over and someone would speak to a physician and call the pt back; [place] advised not to take any insulin until advised by the office  Symptoms  Reason For Call & Symptoms: pt says he is supposed to take Lantus 20 units each am and Humalog TID with meals; took Meformin 500mg  this am; says he took his Lantus this am and accidently took Lantus 20 units again before lunch instead of the Humalog; BS 180 this am; BS 331 now; has not eaten lunch; normally takes Humalog 18 units in the am, 12 units for lunch and 10 units for supper; skipped Humalog this am because he didn't eat breakfast  Reviewed Health History In EMR: Yes  Reviewed Medications In EMR: Yes  Reviewed Allergies In EMR: Yes  Reviewed Surgeries / Procedures: Yes  Date of Onset of Symptoms: 11/06/2013  Guideline(s) Used:  Diabetes - High Blood Sugar  Disposition Per Guideline:   Call Transferred to PCP Now  Reason For Disposition Reached:   Caller has URGENT medication or insulin pump question and triager unable to answer question  Advice Given:  N/A  Patient Will Follow Care Advice:  YES

## 2013-11-06 NOTE — Telephone Encounter (Signed)
Per Dr. Shawna Orleans tell pt to take humalog 5 units with dinner and eat a large carbohydrate meal to keep bs from dropping during the night.  Called pt and repeated 3x to make sure pt understood.  Pt verbalized understanding and had no questions

## 2013-11-22 ENCOUNTER — Other Ambulatory Visit: Payer: Self-pay

## 2013-11-29 ENCOUNTER — Telehealth: Payer: Self-pay | Admitting: Internal Medicine

## 2013-11-29 NOTE — Telephone Encounter (Signed)
Pt is needing a new rx for lofartam 25 mg, sent to [facility] [address] [place]. Pt has appointment scheduled for physical.

## 2013-11-30 MED ORDER — LOSARTAN POTASSIUM 25 MG PO TABS: 25.0000 mg | ORAL_TABLET | Freq: Every day | ORAL | Status: DC

## 2013-11-30 NOTE — Telephone Encounter (Signed)
Referral order placed.

## 2013-12-17 ENCOUNTER — Ambulatory Visit: Payer: Medicare Other | Admitting: Internal Medicine

## 2013-12-28 ENCOUNTER — Other Ambulatory Visit (INDEPENDENT_AMBULATORY_CARE_PROVIDER_SITE_OTHER): Payer: Medicare Other

## 2013-12-28 DIAGNOSIS — Z125 Encounter for screening for malignant neoplasm of prostate: Secondary | ICD-10-CM

## 2013-12-28 DIAGNOSIS — I10 Essential (primary) hypertension: Secondary | ICD-10-CM

## 2013-12-28 DIAGNOSIS — G609 Hereditary and idiopathic neuropathy, unspecified: Secondary | ICD-10-CM

## 2013-12-28 DIAGNOSIS — Z0000 Encounter for general adult medical examination without abnormal findings: Secondary | ICD-10-CM

## 2013-12-28 DIAGNOSIS — E119 Type 2 diabetes mellitus without complications: Secondary | ICD-10-CM

## 2013-12-28 DIAGNOSIS — E785 Hyperlipidemia, unspecified: Secondary | ICD-10-CM

## 2013-12-28 LAB — BASIC METABOLIC PANEL
BUN: 21 mg/dL (ref 6–23)
CO2: 23 mEq/L (ref 19–32)
Calcium: 9.1 mg/dL (ref 8.4–10.5)
Chloride: 105 mEq/L (ref 96–112)
Creatinine, Ser: 1.3 mg/dL (ref 0.4–1.5)
GFR: 58.78 mL/min — ABNORMAL LOW (ref >60.00)
Glucose, Bld: 238 mg/dL — ABNORMAL HIGH (ref 70–99)
POTASSIUM: 4.4 meq/L (ref 3.5–5.1)
Sodium: 138 mEq/L (ref 135–145)

## 2013-12-28 LAB — HEPATIC FUNCTION PANEL
ALT: 31 U/L (ref 0–53)
AST: 31 U/L (ref 0–37)
Albumin: 4.2 g/dL (ref 3.5–5.2)
Alkaline Phosphatase: 73 U/L (ref 39–117)
BILIRUBIN DIRECT: 0.2 mg/dL (ref 0.0–0.3)
Total Bilirubin: 1.3 mg/dL — ABNORMAL HIGH (ref 0.2–1.2)
Total Protein: 6.7 g/dL (ref 6.0–8.3)

## 2013-12-28 LAB — POCT URINALYSIS DIPSTICK
Bilirubin, UA: NEGATIVE
Glucose, UA: NEGATIVE
Ketones, UA: NEGATIVE
Leukocytes, UA: NEGATIVE
Nitrite, UA: NEGATIVE
PH UA: 5.5
PROTEIN UA: NEGATIVE
SPEC GRAV UA: 1.01
UROBILINOGEN UA: 0.2

## 2013-12-28 LAB — TSH: TSH: 0.67 u[IU]/mL (ref 0.35–4.50)

## 2013-12-28 LAB — CBC WITH DIFFERENTIAL/PLATELET
Basophils Absolute: 0 10*3/uL (ref 0.0–0.1)
Basophils Relative: 0.2 % (ref 0.0–3.0)
EOS PCT: 2.9 % (ref 0.0–5.0)
Eosinophils Absolute: 0.1 10*3/uL (ref 0.0–0.7)
HEMATOCRIT: 41.6 % (ref 39.0–52.0)
Hemoglobin: 14.3 g/dL (ref 13.0–17.0)
LYMPHS ABS: 1.7 10*3/uL (ref 0.7–4.0)
Lymphocytes Relative: 53.7 % — ABNORMAL HIGH (ref 12.0–46.0)
MCHC: 34.3 g/dL (ref 30.0–36.0)
MCV: 104.2 fl — ABNORMAL HIGH (ref 78.0–100.0)
MONOS PCT: 11.7 % (ref 3.0–12.0)
Monocytes Absolute: 0.4 10*3/uL (ref 0.1–1.0)
Neutro Abs: 1 10*3/uL — ABNORMAL LOW (ref 1.4–7.7)
Neutrophils Relative %: 31.5 % — ABNORMAL LOW (ref 43.0–77.0)
Platelets: 120 10*3/uL — ABNORMAL LOW (ref 150.0–400.0)
RBC: 3.99 Mil/uL — AB (ref 4.22–5.81)
RDW: 13.4 % (ref 11.5–15.5)
WBC: 3.2 10*3/uL — AB (ref 4.0–10.5)

## 2013-12-28 LAB — MICROALBUMIN / CREATININE URINE RATIO
CREATININE, U: 95.7 mg/dL
MICROALB UR: 1.1 mg/dL (ref 0.0–1.9)
MICROALB/CREAT RATIO: 1.1 mg/g (ref 0.0–30.0)

## 2013-12-28 LAB — LIPID PANEL
CHOLESTEROL: 120 mg/dL (ref 0–200)
HDL: 39.4 mg/dL (ref >39.00)
LDL CALC: 61 mg/dL (ref 0–99)
Total CHOL/HDL Ratio: 3
Triglycerides: 99 mg/dL (ref 0.0–149.0)
VLDL: 19.8 mg/dL (ref 0.0–40.0)

## 2013-12-28 LAB — HEMOGLOBIN A1C: Hgb A1c MFr Bld: 8.4 % — ABNORMAL HIGH (ref 4.6–6.5)

## 2013-12-28 LAB — PSA: PSA: 0.3 ng/mL (ref 0.10–4.00)

## 2014-01-04 ENCOUNTER — Encounter: Payer: Self-pay | Admitting: Internal Medicine

## 2014-01-04 ENCOUNTER — Ambulatory Visit (INDEPENDENT_AMBULATORY_CARE_PROVIDER_SITE_OTHER): Payer: Medicare Other | Admitting: Internal Medicine

## 2014-01-04 VITALS — BP 136/62 | HR 80 | Temp 98.4°F | Ht 67.0 in | Wt 190.0 lb

## 2014-01-04 DIAGNOSIS — G622 Polyneuropathy due to other toxic agents: Secondary | ICD-10-CM

## 2014-01-04 DIAGNOSIS — Z0000 Encounter for general adult medical examination without abnormal findings: Secondary | ICD-10-CM

## 2014-01-04 DIAGNOSIS — I10 Essential (primary) hypertension: Secondary | ICD-10-CM

## 2014-01-04 DIAGNOSIS — E119 Type 2 diabetes mellitus without complications: Secondary | ICD-10-CM

## 2014-01-04 DIAGNOSIS — C9201 Acute myeloblastic leukemia, in remission: Secondary | ICD-10-CM

## 2014-01-04 DIAGNOSIS — G619 Inflammatory polyneuropathy, unspecified: Secondary | ICD-10-CM

## 2014-01-04 MED ORDER — INSULIN PEN NEEDLE 31G X 6 MM MISC: 1.0000 | Freq: Four times a day (QID) | Status: DC

## 2014-01-04 MED ORDER — METFORMIN HCL 500 MG PO TABS: 500.0000 mg | ORAL_TABLET | Freq: Two times a day (BID) | ORAL | Status: DC

## 2014-01-04 MED ORDER — ATORVASTATIN CALCIUM 20 MG PO TABS: ORAL_TABLET | ORAL | Status: DC

## 2014-01-04 MED ORDER — INSULIN GLARGINE 100 UNIT/ML SOLOSTAR PEN: 25.0000 [IU] | PEN_INJECTOR | Freq: Every day | SUBCUTANEOUS | Status: DC

## 2014-01-04 MED ORDER — LOSARTAN POTASSIUM 25 MG PO TABS: 25.0000 mg | ORAL_TABLET | Freq: Every day | ORAL | Status: AC

## 2014-01-04 MED ORDER — AMLODIPINE-ATORVASTATIN 2.5-10 MG PO TABS: 1.0000 | ORAL_TABLET | Freq: Every day | ORAL | Status: DC

## 2014-01-04 MED ORDER — INSULIN LISPRO 100 UNIT/ML (KWIKPEN): PEN_INJECTOR | SUBCUTANEOUS | Status: DC

## 2014-01-04 MED ORDER — ACCU-CHEK MULTICLIX LANCETS MISC: Status: DC

## 2014-01-04 MED ORDER — INSULIN LISPRO 100 UNIT/ML (KWIKPEN)
PEN_INJECTOR | SUBCUTANEOUS | Status: DC
Start: 2014-01-04 — End: 2014-01-04

## 2014-01-04 MED ORDER — INSULIN GLARGINE 100 UNIT/ML SOLOSTAR PEN
25.0000 [IU] | PEN_INJECTOR | Freq: Every day | SUBCUTANEOUS | Status: DC
Start: 2014-01-04 — End: 2014-01-09

## 2014-01-04 MED ORDER — LOSARTAN POTASSIUM 25 MG PO TABS: 25.0000 mg | ORAL_TABLET | Freq: Every day | ORAL | Status: DC

## 2014-01-04 NOTE — Patient Instructions (Signed)
Bring blood sugar and dietary log to your next follow up appointment

## 2014-01-04 NOTE — Assessment & Plan Note (Signed)
Followed by Dr. Florene Glen at [facility]

## 2014-01-04 NOTE — Assessment & Plan Note (Addendum)
Reviewed adult health maintenance protocols.  Medicare questionnaire form reviewed in detail. See attached form. Patient already has upcoming retinal exam with his ophthalmologist in one month. Patient up-to-date with adult vaccines. Patient to inquire with his insurance complaint regarding coverage for shingles vaccine.

## 2014-01-04 NOTE — Assessment & Plan Note (Signed)
Patient encouraged to examine his feet daily.  He wears sneakers with wide toe box.

## 2014-01-04 NOTE — Assessment & Plan Note (Signed)
Stable.  No change in medication. 

## 2014-01-04 NOTE — Progress Notes (Signed)
Subjective:    Patient ID: Andre Hill, male    DOB: September 29, 1945, 68 y.o.   MRN: [id number]  HPI  68 year old Asian male with history of uncontrolled type 2 diabetes, hypertension, hyperlipidemia, acute myeloid leukemia in remission for routine Medicare physical.  Medicare questionnaire form reviewed in detail. He does not drink. He has smoked in the past. He's quit smoking in 1995. He does not use any illicit drugs. He does not exercise a regular basis. He is not sexually active.  He is able to perform most household chores. He does not need any assistance with activities of daily living. He denies any recent falls. He is not afraid of falling. He denies any hearing loss or memory loss.  Patient reports he last received tetanus vaccine in 2010 before treatment for AML. His last eye exam was 6 months ago. He was referred to retinal specialist Dr. Zigmund Daniel. He has appt next month.  His last colonoscopy was in 05/15/2012.  Type 2 diabetes-patient reports his fasting blood sugars have averaged around 180. Patient reports poor dietary compliance. He recently visited family in [place] and [place]. He has not adjusted his insulin dose. His last A1c 8.4  Hypertension - stable Lab Results  Component Value Date   NA 138 12/28/2013   K 4.4 12/28/2013   CL 105 12/28/2013   CO2 23 12/28/2013   Lab Results  Component Value Date   CREATININE 1.3 12/28/2013    Hyperlipidemia - stable  Lab Results  Component Value Date   CHOL 120 12/28/2013   HDL 39.40 12/28/2013   LDLCALC 61 12/28/2013   LDLDIRECT 46.3 11/14/2012   TRIG 99.0 12/28/2013   CHOLHDL 3 12/28/2013   Lab Results  Component Value Date   ALT 31 12/28/2013   AST 31 12/28/2013   ALKPHOS 73 12/28/2013   BILITOT 1.3* 12/28/2013    Review of Systems  Constitutional: Negative for activity change, appetite change and unexpected weight change.  Eyes: Negative for visual disturbance.  Respiratory: Negative for cough, chest tightness and  shortness of breath.   Cardiovascular: Negative for chest pain.  Genitourinary: Negative for difficulty urinating.  Neurological: Negative for headaches.  Gastrointestinal: Negative for abdominal pain, heartburn melena or hematochezia Psych: Negative for depression or anxiety Endo:  No polyuria or polydypsia        Past Medical History  Diagnosis Date  . Blind     partial  . CVA (cerebral infarction)   . Chest pain     unspec  . Helicobacter pylori gastritis     hx  . Polyneuropathy   . Leg pain, right   . Tenosynovitis   . HTN (hypertension)   . HLD (hyperlipidemia)   . DM2 (diabetes mellitus, type 2)     onset 20-25 yrs  . AML (acute myeloid leukemia) 2011    [facility] - Dr. Jerrye Noble  . Shingles 2011    left flank    History   Social History  . Marital Status: Married    Spouse Name: N/A    Number of Children: N/A  . Years of Education: N/A   Occupational History  . Not on file.   Social History Main Topics  . Smoking status: Former Research scientist (life sciences)  . Smokeless tobacco: Not on file     Comment: 20 yrs ago (20-25 pack year hx)   . Alcohol Use: No     Comment: quit drinking years ago   . Drug Use:   . Sexual Activity:  Other Topics Concern  . Not on file   Social History Narrative  . No narrative on file    Past Surgical History  Procedure Laterality Date  . Cataract extraction    . Laparotomy    . Colonoscopy  04/27/2012    2 small polyps  . Left eye surgery  12/09    for glaucoma  . Kidney surgery      No family history on file.  Not on File  Current Outpatient Prescriptions on File Prior to Visit  Medication Sig Dispense Refill  . amLODipine-atorvastatin (CADUET) 5-20 MG per tablet Take 0.5 tablets by mouth daily.  90 tablet  1  . atorvastatin (LIPITOR) 20 MG tablet TAKE ONE TABLET BY MOUTH ONCE DAILY  90 tablet  0  . dorzolamide (TRUSOPT) 2 % ophthalmic solution Place 1 drop into both eyes 3 (three) times daily.      Marland Kitchen glucose blood  (ACCU-CHEK AVIVA PLUS) test strip Use 3 (three) times daily.  100 each  11  . insulin glargine (LANTUS) 100 UNIT/ML injection Inject 0.2 mLs (20 Units total) into the skin daily.  5 pen  5  . insulin lispro (HUMALOG KWIKPEN) 100 UNIT/ML SOPN Inject 18 units at breakfast, 12 units at lunch, and 10 units at dinner  3 mL  5  . Lancets (ACCU-CHEK MULTICLIX) lancets Use 3 (three) times daily.  100 each  11  . losartan (COZAAR) 25 MG tablet Take 1 tablet (25 mg total) by mouth daily.  90 tablet  0  . LUMIGAN 0.01 % SOLN Place 1 drop into both eyes at bedtime.      . metFORMIN (GLUCOPHAGE) 500 MG tablet Take 1 tablet (500 mg total) by mouth 2 (two) times daily with a meal.  180 tablet  1  . vitamin C (ASCORBIC ACID) 500 MG tablet Take 500 mg by mouth daily.       No current facility-administered medications on file prior to visit.    BP 136/62  Pulse 80  Temp(Src) 98.4 F (36.9 C) (Oral)  Ht 5\' 7"  (1.702 m)  Wt 190 lb (86.183 kg)  BMI 29.75 kg/m2    Objective:   Physical Exam  Constitutional: He is oriented to person, place, and time. He appears well-developed and well-nourished.  HENT:  Head: Normocephalic and atraumatic.  Right Ear: External ear normal.  Left Ear: External ear normal.  Mouth/Throat: Oropharynx is clear and moist.  Eyes: Conjunctivae and EOM are normal. Pupils are equal, round, and reactive to light.  Neck: Neck supple.  No carotid bruit  Cardiovascular: Normal rate, regular rhythm and normal heart sounds.   No murmur heard. Pulmonary/Chest: Effort normal and breath sounds normal. He has no wheezes.  Abdominal: Soft. Bowel sounds are normal. There is no tenderness.  Genitourinary: Rectum normal and prostate normal. Guaiac negative stool.  Musculoskeletal: Normal range of motion. He exhibits no edema.  Lymphadenopathy:    He has no cervical adenopathy.  Neurological: He is alert and oriented to person, place, and time. No cranial nerve deficit.  Decreased sensation  to temperature and vibration in lower extremities  Skin: Skin is warm and dry.  Psychiatric: He has a normal mood and affect. His behavior is normal.          Assessment & Plan:

## 2014-01-04 NOTE — Assessment & Plan Note (Signed)
Patient's fasting blood sugar poorly controlled. His last A1c was 8.4. Increase Lantus to 25 units at bedtime. Patient advised to monitor his blood sugar 4 times a day (especially postprandial blood sugars). He'll likely need adjustment of his mealtime insulin dose. Reassess in one month. Lab Results  Component Value Date   HGBA1C 8.4* 12/28/2013   Lab Results  Component Value Date   CREATININE 1.3 12/28/2013

## 2014-01-04 NOTE — Progress Notes (Signed)
Pre visit review using our clinic review tool, if applicable. No additional management support is needed unless otherwise documented below in the visit note. 

## 2014-01-08 ENCOUNTER — Telehealth: Payer: Self-pay | Admitting: Internal Medicine

## 2014-01-08 MED ORDER — INSULIN PEN NEEDLE 31G X 6 MM MISC: 1.0000 | Freq: Four times a day (QID) | Status: DC

## 2014-01-08 MED ORDER — INSULIN LISPRO 100 UNIT/ML (KWIKPEN): PEN_INJECTOR | SUBCUTANEOUS | Status: DC

## 2014-01-08 NOTE — Telephone Encounter (Signed)
Pt states he did not receive his 90 day supply for his insulin lispro (HUMALOG KWIKPEN) 100 UNIT/ML KiwkPen or his insulin pen needle 31-g x 6 mm misc. Pt wants to know why.

## 2014-01-08 NOTE — Telephone Encounter (Signed)
Per pharmacist at [facility] the rx needs to be written for 45 mL for a 90 day supply.  Rx sent in electronically and also changed the pens to qty of 400 with 3 refills

## 2014-01-09 ENCOUNTER — Telehealth: Payer: Self-pay | Admitting: Internal Medicine

## 2014-01-09 MED ORDER — ACCU-CHEK MULTICLIX LANCETS MISC: Status: DC

## 2014-01-09 MED ORDER — INSULIN LISPRO 100 UNIT/ML (KWIKPEN): PEN_INJECTOR | SUBCUTANEOUS | Status: DC

## 2014-01-09 MED ORDER — INSULIN PEN NEEDLE 31G X 6 MM MISC: 1.0000 | Freq: Four times a day (QID) | Status: AC

## 2014-01-09 MED ORDER — INSULIN GLARGINE 100 UNIT/ML SOLOSTAR PEN: 25.0000 [IU] | PEN_INJECTOR | Freq: Every day | SUBCUTANEOUS | Status: DC

## 2014-01-09 NOTE — Telephone Encounter (Signed)
Please add to 90 day re-fills list: insulin lispro (HUMALOG KWIKPEN) 100 UNIT/ML KiwkPen Insulin Pen Needle 31G X 6 MM MISC Lancets (ACCU-CHEK MULTICLIX) lancets

## 2014-01-09 NOTE — Telephone Encounter (Signed)
rx sent in electronically 

## 2014-01-09 NOTE — Telephone Encounter (Signed)
[facility] - [place], [place] - [address] is requesting 90 day re-fill on Insulin Glargine (LANTUS SOLOSTAR) 100 UNIT/ML Solostar Pen

## 2014-02-04 ENCOUNTER — Ambulatory Visit (INDEPENDENT_AMBULATORY_CARE_PROVIDER_SITE_OTHER): Payer: Medicare Other | Admitting: Internal Medicine

## 2014-02-04 ENCOUNTER — Encounter: Payer: Self-pay | Admitting: Internal Medicine

## 2014-02-04 VITALS — BP 112/62 | HR 84 | Temp 98.6°F | Ht 67.0 in | Wt 187.0 lb

## 2014-02-04 DIAGNOSIS — E785 Hyperlipidemia, unspecified: Secondary | ICD-10-CM

## 2014-02-04 DIAGNOSIS — E1165 Type 2 diabetes mellitus with hyperglycemia: Principal | ICD-10-CM

## 2014-02-04 DIAGNOSIS — IMO0001 Reserved for inherently not codable concepts without codable children: Secondary | ICD-10-CM

## 2014-02-04 DIAGNOSIS — I10 Essential (primary) hypertension: Secondary | ICD-10-CM

## 2014-02-04 MED ORDER — ATORVASTATIN CALCIUM 20 MG PO TABS: ORAL_TABLET | ORAL | Status: AC

## 2014-02-04 MED ORDER — ACCU-CHEK SOFTCLIX LANCETS MISC: Status: AC

## 2014-02-04 MED ORDER — AMLODIPINE BESYLATE 2.5 MG PO TABS: 2.5000 mg | ORAL_TABLET | Freq: Every day | ORAL | Status: AC

## 2014-02-04 MED ORDER — INSULIN LISPRO 100 UNIT/ML (KWIKPEN)
PEN_INJECTOR | SUBCUTANEOUS | Status: AC
Start: 2014-02-04 — End: ?

## 2014-02-04 MED ORDER — ACCU-CHEK SOFTCLIX LANCETS MISC: Status: DC

## 2014-02-04 MED ORDER — GLUCOSE BLOOD VI STRP: ORAL_STRIP | Status: AC

## 2014-02-04 NOTE — Assessment & Plan Note (Addendum)
Well controlled.  Monitor electrolytes and kidney function before next office visit. BP: 112/62 mmHg  Lab Results  Component Value Date   CREATININE 1.3 12/28/2013

## 2014-02-04 NOTE — Assessment & Plan Note (Signed)
Overall patient's blood sugar control improving. Patient advised to decrease his morning insulin from 18 to14 units when he engages in exercise program. I also reiterated the need to adjust evening mealtime dose depending on carbohydrate intake. He is accompanied by his supportive wife who will help him count carbohydrates.    Patient only advised to use of short acting sliding scale if his blood sugars are greater than 300.

## 2014-02-04 NOTE — Patient Instructions (Addendum)
Please complete the following lab tests before your next follow up appointment: BMET - 401.9 A1c, Microalb / Cr ratio - 250.02

## 2014-02-04 NOTE — Assessment & Plan Note (Signed)
Well controlled.  Continue same dose of atorvastatin. Lab Results  Component Value Date   CHOL 120 12/28/2013   HDL 39.40 12/28/2013   LDLCALC 61 12/28/2013   LDLDIRECT 46.3 11/14/2012   TRIG 99.0 12/28/2013   CHOLHDL 3 12/28/2013

## 2014-02-04 NOTE — Progress Notes (Signed)
Subjective:    Patient ID: Andre Hill, male    DOB: 06/26/1946, 68 y.o.   MRN: [id number]  HPI  68 year old Asian male with history of uncontrolled type 2 diabetes, hypertension and history of AML for followup. Patient has been monitoring his blood sugars directed 4 times a day since previous visit. He has also kept dietary log. His fasting blood sugars are usually between 100 to130. He frequently has blood sugars greater than 150 after evening meal. He has had 2-3 hypoglycemic episodes.  Patient also started an exercise regimen after eating his morning meal.  Overall, his blood sugar control is much better.    Hypertension - stable.  Hyperlipidemia - stable   Review of Systems Negative for chest pain,  Modest intentional weight loss    Past Medical History  Diagnosis Date  . Blind     partial  . CVA (cerebral infarction)   . Chest pain     unspec  . Helicobacter pylori gastritis     hx  . Polyneuropathy   . Leg pain, right   . Tenosynovitis   . HTN (hypertension)   . HLD (hyperlipidemia)   . DM2 (diabetes mellitus, type 2)     onset 20-25 yrs  . AML (acute myeloid leukemia) 2011    [facility] - Dr. Jerrye Noble  . Shingles 2011    left flank    History   Social History  . Marital Status: Married    Spouse Name: N/A    Number of Children: N/A  . Years of Education: N/A   Occupational History  . Not on file.   Social History Main Topics  . Smoking status: Former Research scientist (life sciences)  . Smokeless tobacco: Not on file     Comment: 20 yrs ago (20-25 pack year hx)   . Alcohol Use: No     Comment: quit drinking years ago   . Drug Use:   . Sexual Activity:    Other Topics Concern  . Not on file   Social History Narrative   Physician roster:      Hematology-Dr. Florene Glen   Urology-Dr. Rosana Hoes   Ophthalmologist/retinal specialist-Dr. Zigmund Daniel    Past Surgical History  Procedure Laterality Date  . Cataract extraction    . Laparotomy    . Colonoscopy  04/27/2012    2 small  polyps  . Left eye surgery  12/09    for glaucoma  . Kidney surgery      No family history on file.  Not on File  Current Outpatient Prescriptions on File Prior to Visit  Medication Sig Dispense Refill  . amlodipine-atorvastatin (CADUET) 2.5-10 MG per tablet Take 1 tablet by mouth daily.  90 tablet  3  . atorvastatin (LIPITOR) 20 MG tablet TAKE ONE TABLET BY MOUTH ONCE DAILY  90 tablet  3  . dorzolamide (TRUSOPT) 2 % ophthalmic solution Place 1 drop into both eyes 3 (three) times daily.      Marland Kitchen glucose blood (ACCU-CHEK AVIVA PLUS) test strip Use 3 (three) times daily.  100 each  11  . Insulin Glargine (LANTUS SOLOSTAR) 100 UNIT/ML Solostar Pen Inject 25 Units into the skin daily at 10 pm.  45 mL  1  . insulin lispro (HUMALOG KWIKPEN) 100 UNIT/ML KiwkPen Inject 20 units twice daily 15 minutes before meals  45 mL  1  . Insulin Pen Needle 31G X 6 MM MISC Inject 1 each into the skin 4 (four) times daily.  400 each  3  .  losartan (COZAAR) 25 MG tablet Take 1 tablet (25 mg total) by mouth daily.  90 tablet  1  . LUMIGAN 0.01 % SOLN Place 1 drop into both eyes at bedtime.      . metFORMIN (GLUCOPHAGE) 500 MG tablet Take 1 tablet (500 mg total) by mouth 2 (two) times daily with a meal.  180 tablet  1  . vitamin C (ASCORBIC ACID) 500 MG tablet Take 500 mg by mouth daily.       No current facility-administered medications on file prior to visit.    BP 112/62  Pulse 84  Temp(Src) 98.6 F (37 C) (Oral)  Ht 5\' 7"  (1.702 m)  Wt 187 lb (84.823 kg)  BMI 29.28 kg/m2    Objective:   Physical Exam  Constitutional: He is oriented to person, place, and time. He appears well-developed and well-nourished. No distress.  HENT:  Head: Normocephalic and atraumatic.  Cardiovascular: Normal rate, regular rhythm and normal heart sounds.   No murmur heard. Pulmonary/Chest: Effort normal and breath sounds normal. He has no wheezes.  Neurological: He is alert and oriented to person, place, and time. No  cranial nerve deficit.  Psychiatric: He has a normal mood and affect. His behavior is normal.       Assessment & Plan:

## 2014-03-09 ENCOUNTER — Other Ambulatory Visit: Payer: Self-pay | Admitting: Internal Medicine

## 2014-03-11 ENCOUNTER — Telehealth: Payer: Self-pay | Admitting: Internal Medicine

## 2014-03-11 NOTE — Telephone Encounter (Signed)
Opened in error

## 2014-03-18 ENCOUNTER — Encounter (INDEPENDENT_AMBULATORY_CARE_PROVIDER_SITE_OTHER): Payer: Self-pay | Admitting: Ophthalmology

## 2014-03-27 ENCOUNTER — Telehealth: Payer: Self-pay

## 2014-03-27 NOTE — Telephone Encounter (Signed)
Diabetic Bundle-pt states he has moved to [place] so he will not be seeing Dr. Shawna Orleans anymore.

## 2014-04-01 ENCOUNTER — Other Ambulatory Visit: Payer: Medicare Other

## 2014-04-08 ENCOUNTER — Ambulatory Visit: Payer: Medicare Other | Admitting: Internal Medicine

## 2014-05-22 ENCOUNTER — Other Ambulatory Visit: Payer: Self-pay | Admitting: Internal Medicine
